# Patient Record
Sex: Male | Born: 1990 | Race: White | Hispanic: No | Marital: Single | Smoking: Never smoker
Health system: Southern US, Community
[De-identification: ages and names within clinical notes are randomized; demographics above are authoritative.]

---
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
## 2014-09-15 ENCOUNTER — Ambulatory Visit: Payer: Self-pay | Admitting: General Practice

## 2015-09-09 ENCOUNTER — Other Ambulatory Visit: Payer: Self-pay | Admitting: Emergency Medicine

## 2015-09-09 MED ORDER — PAROXETINE HCL 20 MG PO TABS: 20.0000 mg | ORAL_TABLET | Freq: Every day | ORAL | Status: AC

## 2015-09-09 NOTE — Telephone Encounter (Signed)
Med refill approved, pt will need to be seen in clinic after 6 month refill runs out

## 2015-09-09 NOTE — Telephone Encounter (Signed)
Received a faxed medication request from [facility].  Please advise.  Thank you. 

## 2015-09-14 ENCOUNTER — Ambulatory Visit: Payer: Self-pay | Admitting: Registered Nurse

## 2015-09-14 VITALS — BP 100/60 | HR 75 | Temp 98.3°F

## 2015-09-14 DIAGNOSIS — J0100 Acute maxillary sinusitis, unspecified: Secondary | ICD-10-CM

## 2015-09-14 DIAGNOSIS — H6593 Unspecified nonsuppurative otitis media, bilateral: Secondary | ICD-10-CM

## 2015-09-14 DIAGNOSIS — F329 Major depressive disorder, single episode, unspecified: Secondary | ICD-10-CM | POA: Insufficient documentation

## 2015-09-14 DIAGNOSIS — J029 Acute pharyngitis, unspecified: Secondary | ICD-10-CM

## 2015-09-14 DIAGNOSIS — J301 Allergic rhinitis due to pollen: Secondary | ICD-10-CM

## 2015-09-14 DIAGNOSIS — F419 Anxiety disorder, unspecified: Secondary | ICD-10-CM | POA: Insufficient documentation

## 2015-09-14 MED ORDER — AMOXICILLIN-POT CLAVULANATE 875-125 MG PO TABS: 1.0000 | ORAL_TABLET | Freq: Two times a day (BID) | ORAL | Status: AC

## 2015-09-14 MED ORDER — ACETAMINOPHEN 500 MG PO TABS: 1000.0000 mg | ORAL_TABLET | Freq: Four times a day (QID) | ORAL | Status: AC | PRN

## 2015-09-14 MED ORDER — PREDNISONE 20 MG PO TABS: 20.0000 mg | ORAL_TABLET | Freq: Every day | ORAL | Status: AC

## 2015-09-14 MED ORDER — SALINE SPRAY 0.65 % NA SOLN
2.0000 | NASAL | Status: AC | PRN
Start: 2015-09-14 — End: ?

## 2015-09-14 MED ORDER — LORATADINE 10 MG PO TABS: 10.0000 mg | ORAL_TABLET | Freq: Every day | ORAL | Status: DC

## 2015-09-15 NOTE — Progress Notes (Signed)
Subjective:    Patient ID: Jesse Johnston, male    DOB: 1990-09-27, 25 y.o.   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 The current episode started more than 1 year ago. The problem has been unchanged. Neither side of throat is experiencing more pain than the other. There has been no fever. The pain is at a severity of 5/10. The pain is moderate. Associated symptoms include congestion and headaches. Pertinent negatives include no abdominal pain, coughing, diarrhea, drooling, ear discharge, ear pain, hoarse voice, plugged ear sensation, neck pain, shortness of breath, stridor, swollen glands, trouble swallowing or vomiting. He has had exposure to strep. He has had no exposure to mono. He has tried cool liquids for the symptoms. The treatment provided no relief.      Review of Systems  Constitutional: Negative for fever, chills, diaphoresis, activity change, appetite change and fatigue.  HENT: Positive for congestion, sinus pressure and sore throat. Negative for dental problem, drooling, ear discharge, ear pain, facial swelling, hearing loss, hoarse voice, mouth sores, nosebleeds, postnasal drip, rhinorrhea, sneezing and trouble swallowing.   Eyes: Negative for photophobia, pain, discharge, redness, itching and visual disturbance.  Respiratory: Negative for cough, shortness of breath and stridor.   Gastrointestinal: Negative for vomiting, abdominal pain and diarrhea.  Endocrine: Negative for cold intolerance and heat intolerance.  Genitourinary: Negative for difficulty urinating.  Musculoskeletal: Negative for back pain, joint swelling, gait problem,  neck pain and neck stiffness.  Skin: Negative for rash.  Allergic/Immunologic: Negative for environmental allergies and food allergies.  Neurological: Positive for headaches. Negative for dizziness, tremors, seizures, syncope, facial asymmetry, speech difficulty, weakness, light-headedness and numbness.  Hematological: Negative for adenopathy. Does not bruise/bleed easily.  Psychiatric/Behavioral: Negative for sleep disturbance.       Objective:   Physical Exam  Constitutional: He is oriented to person, place, and time. Vital signs are normal. He appears well-developed and well-nourished. He is active and cooperative.  Non-toxic appearance. He does not have a sickly appearance. He appears ill. No distress.  HENT:  Head: Normocephalic and atraumatic.  Right Ear: Hearing, external ear and ear canal normal. A middle ear effusion is present.  Left Ear: Hearing, external ear and ear canal normal. A middle ear effusion is present.  Nose: Mucosal edema and rhinorrhea present. No nose lacerations, sinus tenderness, nasal deformity, septal deviation or nasal septal hematoma. No epistaxis.  No foreign bodies. Right sinus exhibits maxillary sinus tenderness. Right sinus exhibits no frontal sinus tenderness. Left sinus exhibits maxillary sinus tenderness. Left sinus exhibits no frontal sinus tenderness.  Mouth/Throat: Uvula is midline and mucous membranes are normal. Mucous membranes are not pale, not dry and not cyanotic. He does not have dentures. No oral lesions. No trismus in the jaw. Normal dentition. No dental abscesses, uvula swelling, lacerations or dental caries. Posterior oropharyngeal edema and posterior oropharyngeal erythema present. No oropharyngeal exudate or tonsillar abscesses.  Cobblestoning posterior pharynx; cryptic tonsils enlarged 3+ symmetrical bilaterally edema/erythema; bilateral nasal turbinates with edema, erythema yellow discharge  Eyes: Conjunctivae, EOM and lids are normal.  Pupils are equal, round, and reactive to light. Right eye exhibits no chemosis, no discharge, no exudate and  no hordeolum. No foreign body present in the right eye. Left eye exhibits no chemosis, no discharge, no exudate and no hordeolum. No foreign body present in the left eye. Right conjunctiva is not injected. Right conjunctiva has no hemorrhage. Left conjunctiva is not injected. Left conjunctiva has no hemorrhage. No scleral icterus. Right eye exhibits normal extraocular motion and no nystagmus. Left eye exhibits normal extraocular motion and no nystagmus. Right pupil is round and reactive. Left pupil is round and reactive. Pupils are equal.  Neck: Trachea normal and normal range of motion. Neck supple. No tracheal tenderness, no spinous process tenderness and no muscular tenderness present. No rigidity. No tracheal deviation, no edema, no erythema and normal range of motion present. No thyroid mass and no thyromegaly present.  Cardiovascular: Normal rate, regular rhythm, S1 normal, S2 normal, normal heart sounds and intact distal pulses.  PMI is not displaced.  Exam reveals no gallop and no friction rub.   No murmur heard. Pulmonary/Chest: Effort normal and breath sounds normal. No stridor. No respiratory distress. He has no decreased breath sounds. He has no wheezes. He has no rhonchi. He has no rales.  Abdominal: Soft. He exhibits no distension.  Musculoskeletal: Normal range of motion. He exhibits no edema or tenderness.  Lymphadenopathy:       Head (right side): No submental, no submandibular, no tonsillar, no preauricular, no posterior auricular and no occipital adenopathy present.       Head (left side): No submental, no submandibular, no tonsillar, no preauricular, no posterior auricular and no occipital adenopathy present.    He has no cervical adenopathy.       Right cervical: No superficial cervical, no deep cervical and no posterior cervical adenopathy present.      Left cervical: No  superficial cervical, no deep cervical and no posterior cervical adenopathy present.  Neurological: He is alert and oriented to person, place, and time. He displays no atrophy and no tremor. No cranial nerve deficit or sensory deficit. He exhibits normal muscle tone. He displays no seizure activity. Coordination and gait normal. GCS eye subscore is 4. GCS verbal subscore is 5. GCS motor subscore is 6.  Skin: Skin is warm, dry and intact. No abrasion, no bruising, no burn, no ecchymosis, no laceration, no lesion, no petechiae and no rash noted. He is not diaphoretic. No cyanosis or erythema. No pallor. Nails show no clubbing.  Psychiatric: He has a normal mood and affect. His speech is normal and behavior is normal. Judgment and thought content normal. Cognition and memory are normal.  Nursing note and vitals reviewed.         Assessment & Plan:  A-maxillary sinusitis acute, pharyngitis acute, bilateral otitis media effusion P-Supportive treatment.   No evidence of invasive bacterial infection, non toxic and well hydrated.  This is most likely self limiting viral infection.  I do not see where any further testing or imaging is necessary at this time.   I will suggest supportive care, rest, good hygiene and encourage the patient to take adequate fluids.  The patient is to return to clinic or EMERGENCY ROOM if symptoms worsen or change significantly e.g. ear pain, fever, purulent discharge from ears or bleeding.  Patient verbalized agreement and understanding of treatment plan.    Usually no specific medical treatment is needed if a virus is causing the sore throat.  The throat most often gets better on its own within 5 to 7 days.  Antibiotic medicine does not cure viral pharyngitis.  Restart claritin  po daily.  Avoid motrin use with paxil.  May use tylenol  po QID. For acute pharyngitis caused by bacteria, your healthcare provider will prescribe an antibiotic.  Marland Kitchen Do not smoke.  Marland Kitchen Avoid  secondhand smoke and other air pollutants.  . Use a cool mist humidifier to add moisture to the air.  . Get plenty of rest.  . You may want to rest your throat by talking less and eating a diet that is mostly liquid or soft for a day or two.   Marland Kitchen Nonprescription throat lozenges and mouthwashes should help relieve the soreness.   . Gargling with warm saltwater and drinking warm liquids may help.  (You can make a saltwater solution by adding 1/4 teaspoon of salt to 8 ounces, or 240 mL, of warm water.)  . A nonprescription pain reliever such as acetaminophen may ease general aches and pains.   FOLLOW UP with clinic provider if no improvements in the next 7-10 days.  Patient verbalized understanding of instructions and agreed with plan of care. P2:  Hand washing and diet.  Restart flonase 1 spray each nostril BID, saline 2 sprays each nostril q2h prn congestion.  If no improvement with 48 hours of saline and flonase use start augmentin  po BID x 10 days. If no improvement with augmentin and flonase start prednisone  po daily in 48 hours x 5 days.  Rx given.  No evidence of systemic bacterial infection, non toxic and well hydrated.  I do not see where any further testing or imaging is necessary at this time.   I will suggest supportive care, rest, good hygiene and encourage the patient to take adequate fluids.  The patient is to return to clinic or EMERGENCY ROOM if symptoms worsen or change significantly.  Exitcare handout on sinusitis given to patient.  Patient verbalized agreement and understanding of treatment plan and had no further questions at this time.   P2:  Hand washing and cover cough

## 2016-07-23 IMAGING — US US SCROTUM W/ DOPPLER COMPLETE
1 series · 13 of 25 positions shown · non-contrast
Comparison: None.

CLINICAL DATA: Left testicular pain for 2 days

EXAM:
SCROTAL ULTRASOUND
DOPPLER ULTRASOUND OF THE TESTICLES
TECHNIQUE: Complete ultrasound examination of the testicles, epididymis, and
other scrotal structures was performed. Color and spectral Doppler
ultrasound were also utilized to evaluate blood flow to the
testicles.

[Series 1: us scrotum w/ doppler complete · 0.08mm/px · 13 of 77 slices shown]
[im 1/77]
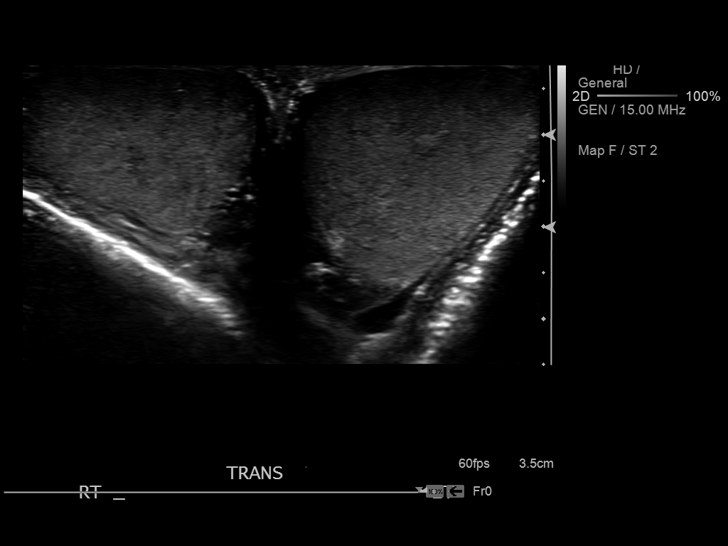
[im 7/77]
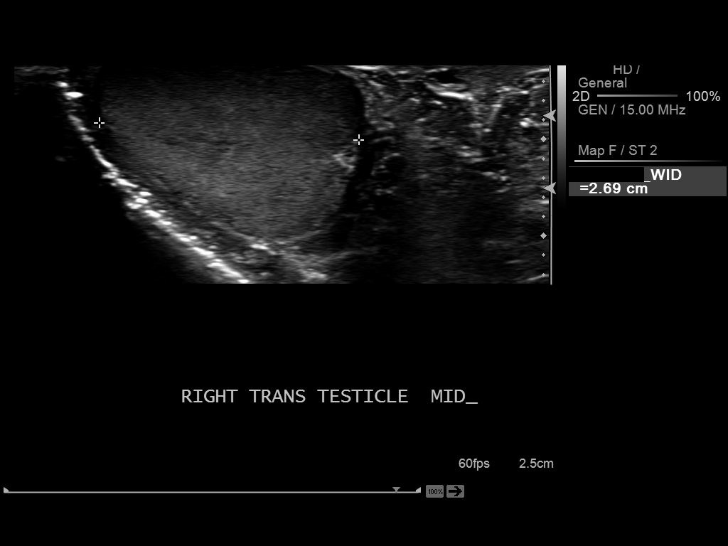
[im 13/77]
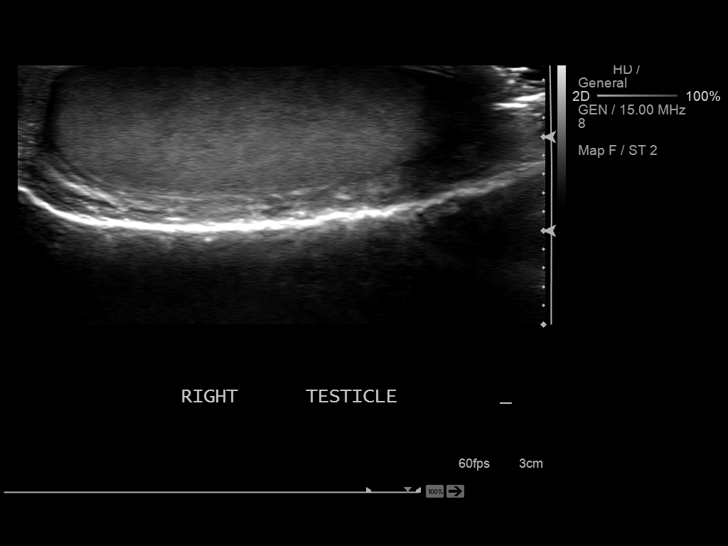
[im 20/77]
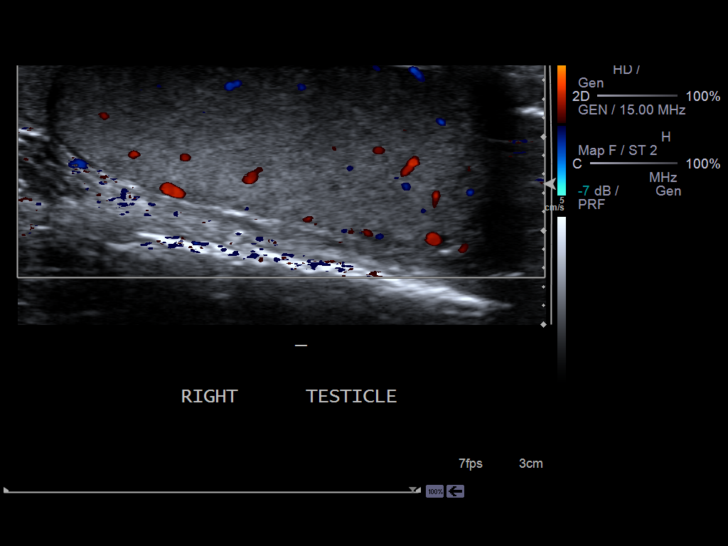
[im 26/77]
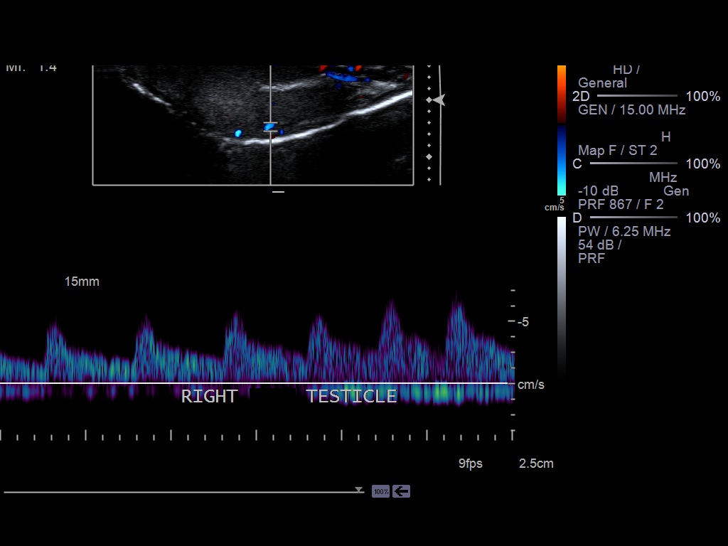
[im 32/77]
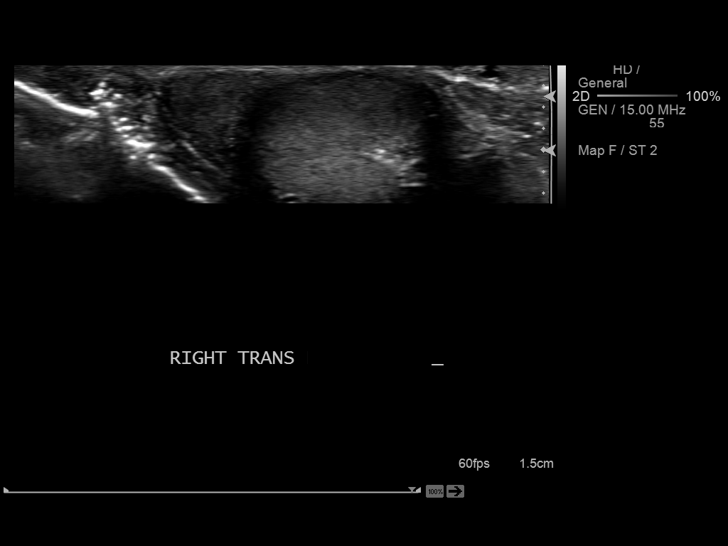
[im 39/77]
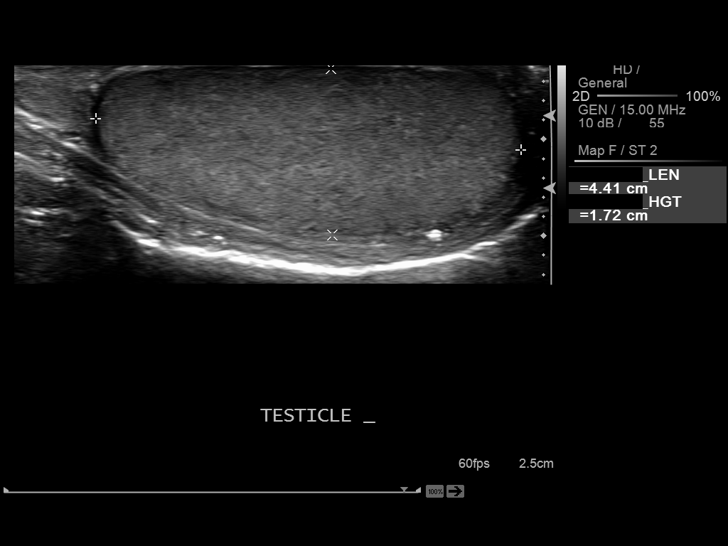
[im 45/77]
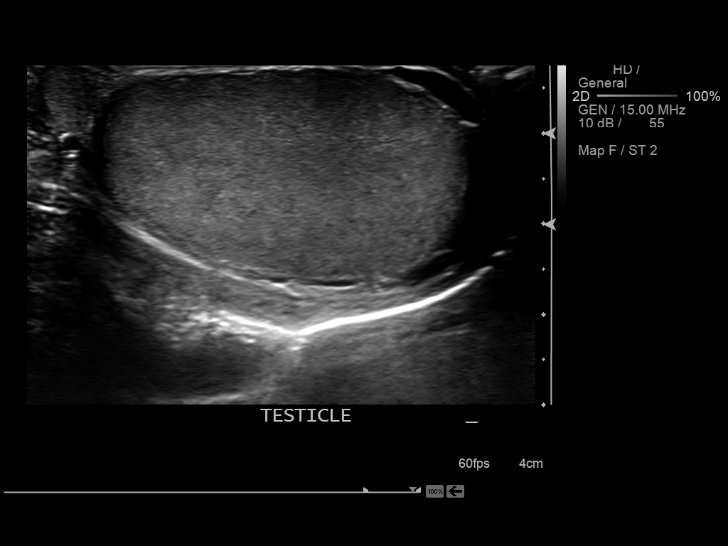
[im 51/77]
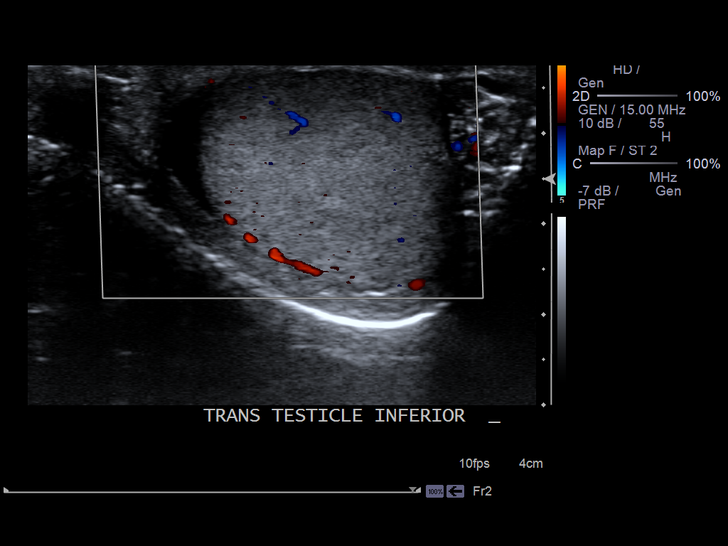
[im 58/77]
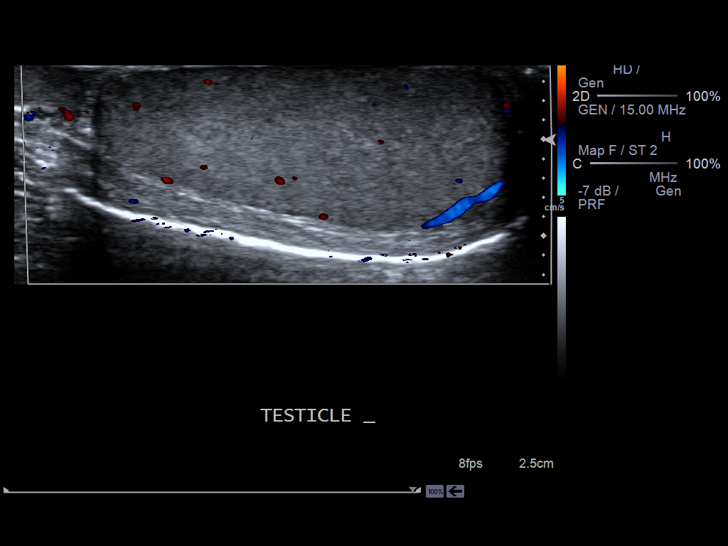
[im 64/77]
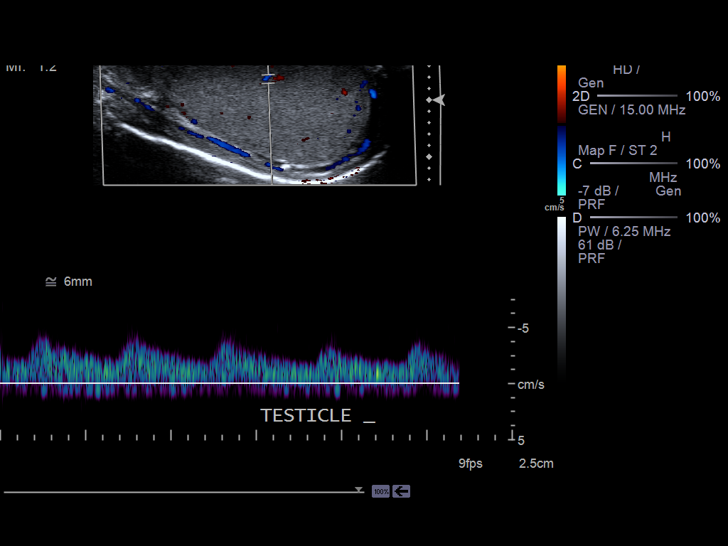
[im 70/77]
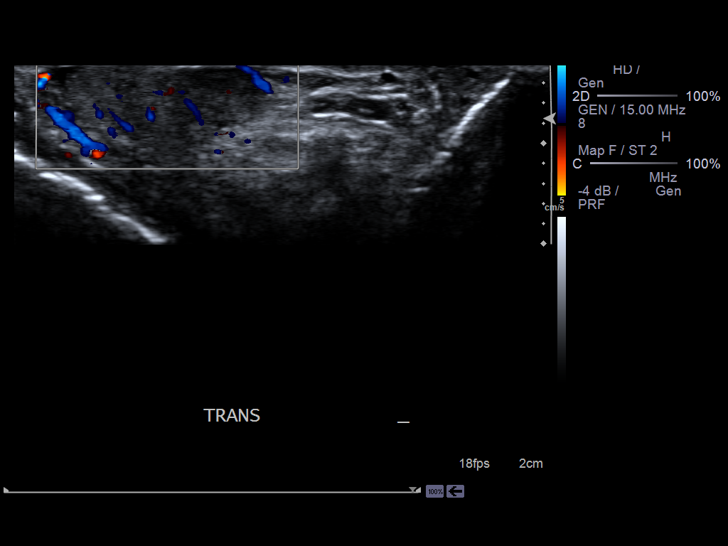
[im 77/77]
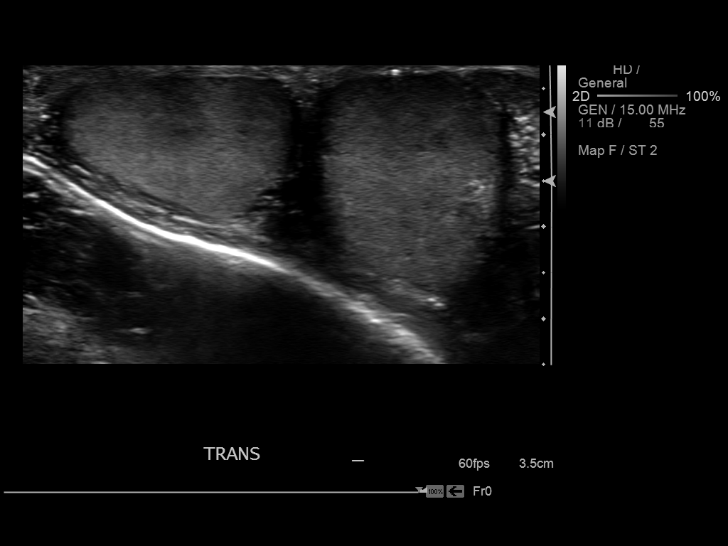

[13 of 25 positions shown; findings below may reference images not displayed]

FINDINGS: Right testicle

Measurements: The right testicle is normal in size measuring 4.6 x
1.9 x 2.7 cm.. No intratesticular abnormality is seen. Blood flow is
demonstrated to the right testicle with arterial and venous
waveforms.

Left testicle

Measurements: The left testicle is normal in size measuring 4.4 x
1.7 x 2.7 cm.. No intratesticular abnormality is seen. Blood flow is
demonstrated to the left testicle with arterial and venous
waveforms.

Right epididymis:  The right epididymis is unremarkable.

Left epididymis:  The left epididymis is unremarkable.

Hydrocele:  A small amount of fluid is noted on the left.

Varicocele:  No hydrocele is seen.

Pulsed Doppler interrogation of both testes demonstrates normal low
resistance arterial and venous waveforms bilaterally.
IMPRESSION: 1. No intratesticular abnormality is seen. Blood flow is
demonstrated to both testicles.
2. There is a small amount of fluid on the left.

## 2017-08-20 ENCOUNTER — Encounter: Payer: Self-pay | Admitting: Family Medicine

## 2017-08-20 ENCOUNTER — Ambulatory Visit: Payer: Self-pay | Admitting: Family Medicine

## 2017-08-20 VITALS — BP 120/82 | HR 82 | Temp 98.1°F

## 2017-08-20 DIAGNOSIS — R300 Dysuria: Secondary | ICD-10-CM

## 2017-08-20 DIAGNOSIS — Z711 Person with feared health complaint in whom no diagnosis is made: Secondary | ICD-10-CM

## 2017-08-20 DIAGNOSIS — J029 Acute pharyngitis, unspecified: Secondary | ICD-10-CM | POA: Insufficient documentation

## 2017-08-20 LAB — POCT URINALYSIS DIPSTICK
Blood, UA: NEGATIVE
Glucose, UA: NEGATIVE
KETONES UA: NEGATIVE
Leukocytes, UA: NEGATIVE
NITRITE UA: NEGATIVE
Spec Grav, UA: >=1.03 — AB (ref 1.010–1.025)
Urobilinogen, UA: 1 E.U./dL
pH, UA: 5.5 (ref 5.0–8.0)

## 2017-08-20 LAB — POCT INFLUENZA A/B
Influenza A, POC: NEGATIVE
Influenza B, POC: NEGATIVE

## 2017-08-20 NOTE — Patient Instructions (Signed)
Drink plenty of fluids to keep yourself well flushed out  If the urinary pain is not improving over the next couple of days I will probably go ahead and start you on an antibiotic anyhow.  I am here Tuesday and Wednesday so let me know if you are not improving.  Get rechecked as needed

## 2017-08-20 NOTE — Progress Notes (Signed)
Patient ID: Jesse Johnston, male    DOB: 02/18/1991  Age: 27 y.o. MRN: [id number]  Chief Complaint  Patient presents with  . Groin Pain    x3 days    Subjective:   Patient is here for dysuria for the last 3 days.  It is a little bit better today.  He has not had any pus or blood from his urine.  He has had an STD in the remote past, I believe chlamydia.  However not had intercourse for 2 or 3 years.  He does masturbate.  He denies any trauma.  No fever or chills.  No history of herpes.  Current allergies, medications, problem list, past/family and social histories reviewed.  Objective:  BP 120/82   Pulse 82   Temp 98.1 F (36.7 C) (Oral)   SpO2 97%   No acute distress.  Normal male external genitalia.  Urethra does not appear inflamed.  No lesions on the penis shaft.  He has a little cyst at the right groin area that he says was a little boil.  He has bad cystic acne elsewhere on his skin.  Urinalysis does not have any blood or WBCs on dip  Assessment & Plan:   Assessment: 1. Dysuria   2. Concern about STD in male without diagnosis   3. Sore throat       Plan: See instructions.  I did decide to work him up for STDs even though it has been a while.  Certainly at least herpes can cause symptoms this far out.  Reassurance.  Call back if not improving and I will call in some doxycycline form.  By error another patient's strep test and sore throat diagnosis got injured, and we are unable to remove it from the computer. Orders Placed This Encounter  Procedures  . GC/Chlamydia Probe Amp    Order Specific Question:   Source    Answer:   dirty urine  . HSV(herpes simplex vrs) 1+2 ab-IgG  . RPR  . HIV antibody  . POCT Influenza A/B (POC66)    No orders of the defined types were placed in this encounter.        Patient Instructions  Drink plenty of fluids to keep yourself well flushed out  If the urinary pain is not improving over the next couple of days I  will probably go ahead and start you on an antibiotic anyhow.  I am here Tuesday and Wednesday so let me know if you are not improving.  Get rechecked as needed    No Follow-up on file.   Cam Dauphin, MD 08/20/2017

## 2017-08-21 LAB — RPR: RPR Ser Ql: NONREACTIVE

## 2017-08-21 LAB — HIV ANTIBODY (ROUTINE TESTING W REFLEX): HIV Screen 4th Generation wRfx: NONREACTIVE

## 2017-08-21 LAB — HSV(HERPES SIMPLEX VRS) I + II AB-IGG
HSV 1 Glycoprotein G Ab, IgG: <0.91 index (ref 0.00–0.90)
HSV 2 IgG, Type Spec: <0.91 index (ref 0.00–0.90)

## 2017-08-22 ENCOUNTER — Ambulatory Visit: Payer: Self-pay | Admitting: Family Medicine

## 2017-08-22 ENCOUNTER — Encounter: Payer: Self-pay | Admitting: Family Medicine

## 2017-08-22 VITALS — BP 118/72 | HR 91 | Temp 98.2°F

## 2017-08-22 DIAGNOSIS — L739 Follicular disorder, unspecified: Secondary | ICD-10-CM

## 2017-08-22 DIAGNOSIS — B356 Tinea cruris: Secondary | ICD-10-CM

## 2017-08-22 LAB — GC/CHLAMYDIA PROBE AMP
CHLAMYDIA, DNA PROBE: NEGATIVE
NEISSERIA GONORRHOEAE BY PCR: NEGATIVE

## 2017-08-22 MED ORDER — TERBINAFINE HCL 1 % EX CREA: 1.0000 "application " | TOPICAL_CREAM | Freq: Two times a day (BID) | CUTANEOUS | 0 refills | Status: AC

## 2017-08-22 MED ORDER — DOXYCYCLINE HYCLATE 100 MG PO TABS: 100.0000 mg | ORAL_TABLET | Freq: Two times a day (BID) | ORAL | 0 refills | Status: AC

## 2017-08-22 NOTE — Progress Notes (Signed)
Patient ID: Leafy RoDaniel Michael Fatzinger, male    DOB: 03/26/1991  Age: 27 y.o. MRN: [id number]  Chief Complaint  Patient presents with  . Abscess    Subjective:   The dysuria he had a few days ago has resolved.  He did showed me a little cystic area to the right of the pain is at the top of the scrotum the other day which did not look very remarkable.    Current allergies, medications, problem list, past/family and social histories reviewed.  Objective:  BP 118/72 (BP Location: Right Arm, Patient Position: Sitting)   Pulse 91   Temp 98.2 F (36.8 C) (Tympanic)   SpO2 96%   Unfortunately I am now seeing that he does have some rash in the crease of the groin lateral to that and down between his legs.  This area is red and inflamed and a little tender.  It is well demarcated.  It appears to be a typical tinea cruris.  He does still have that little acneiform cyst in the hair at the top of the scrotum.  Assessment & Plan:   Assessment: 1. Tinea cruris   2. Folliculitis       Plan: He has tinea cruris but he also has the cystic acne.  I think I will treat him for both.  No orders of the defined types were placed in this encounter.   No orders of the defined types were placed in this encounter.        Patient Instructions  You do have the tinea cruris (jock itch) which is a fungal infection of the groin.  You can use some terbinafine cream twice daily on that.  If it does not seem to clear it up there is oral medication that can also be tried, but usually the cream works fine.  Take doxycycline 100 mg twice daily for 10 days for the little cysts you have in the skin on the scrotum.  The graft be careful about prolonged sun exposure while on this medicine since you can send it is here.  Wash well at least once daily and dry the skin well between your legs.  Return as needed   Jock Itch Jock itch (tinea cruris) is a fungal infection of the skin in the groin area. It is  sometimes called ringworm, even though it is not caused by worms. It is caused by a fungus, which is a type of germ that thrives in dark, damp places. Jock itch causes a rash and itching in the groin and upper thigh area. It usually goes away in 2-3 weeks with treatment. What are the causes? The fungus that causes jock itch may be spread by:  Touching a fungus infection elsewhere on your body-such as athlete's foot-and then touching your groin area.  Sharing towels or clothing with an infected person.  What increases the risk? Jock itch is most common in men and adolescent boys. This condition is more likely to develop from:  Being in hot, humid climates.  Wearing tight-fitting clothing or wet bathing suits for long periods of time.  Participating in sports.  Being overweight.  Having diabetes.  What are the signs or symptoms? Symptoms of jock itch may include:  A red, pink, or brown rash in the groin area. The rash may spread to the thighs, anus, and buttocks.  Dry and scaly skin on or around the rash.  Itchiness.  How is this diagnosed? Most often, a health care provider can make the  diagnosis by looking at your rash. Sometimes, a scraping of the infected skin will be taken. This sample may be tested by looking at it under a microscope or by trying to grow the fungus from the sample (culture). How is this treated? Treatment for this condition may include:  Antifungal medicine to kill the fungus. This may be in various forms: ? Skin cream or ointment. ? Medicine taken by mouth.  Skin cream or ointment to reduce the itching.  Compresses or medicated powders to dry the infected skin.  Follow these instructions at home:  Take medicines only as directed by your health care provider. Apply skin creams or ointments exactly as directed.  Wear loose-fitting clothing. ? Men should wear cotton boxer shorts. ? Women should wear cotton underwear.  Change your underwear every  day to keep your groin dry.  Avoid hot baths.  Dry your groin area well after bathing. ? Use a separate towel to dry your groin area. This will help to prevent a spreading of the infection to other areas of your body.  Do not scratch the affected area.  Do not share towels with other people. Contact a health care provider if:  Your rash does not improve or it gets worse after 2 weeks of treatment.  Your rash is spreading.  Your rash returns after treatment is finished.  You have a fever.  You have redness, swelling, or pain in the area around your rash.  You have fluid, blood, or pus coming from your rash.  Your have your rash for more than 4 weeks. This information is not intended to replace advice given to you by your health care provider. Make sure you discuss any questions you have with your health care provider. Document Released: 06/23/2002 Document Revised: 12/09/2015 Document Reviewed: 04/14/2014 Elsevier Interactive Patient Education  [employer].  Return as needed    No Follow-up on file.   HOPPER,DAVID, MD 08/22/2017

## 2017-08-22 NOTE — Progress Notes (Signed)
Pt reports "rash" to R groin that he noticed yesterday. Is TTP and "hot". Denies any drainage. Reports boil in same location one month ago that he drained himself and "thought he got all of it out."

## 2017-08-22 NOTE — Patient Instructions (Addendum)
You do have the tinea cruris (jock itch) which is a fungal infection of the groin.  You can use some terbinafine cream twice daily on that.  If it does not seem to clear it up there is oral medication that can also be tried, but usually the cream works fine.  Take doxycycline 100 mg twice daily for 10 days for the little cysts you have in the skin on the scrotum.  The graft be careful about prolonged sun exposure while on this medicine since you can send it is here.  Wash well at least once daily and dry the skin well between your legs.  Return as needed   Jock Itch Jock itch (tinea cruris) is a fungal infection of the skin in the groin area. It is sometimes called ringworm, even though it is not caused by worms. It is caused by a fungus, which is a type of germ that thrives in dark, damp places. Jock itch causes a rash and itching in the groin and upper thigh area. It usually goes away in 2-3 weeks with treatment. What are the causes? The fungus that causes jock itch may be spread by:  Touching a fungus infection elsewhere on your body-such as athlete's foot-and then touching your groin area.  Sharing towels or clothing with an infected person.  What increases the risk? Jock itch is most common in men and adolescent boys. This condition is more likely to develop from:  Being in hot, humid climates.  Wearing tight-fitting clothing or wet bathing suits for long periods of time.  Participating in sports.  Being overweight.  Having diabetes.  What are the signs or symptoms? Symptoms of jock itch may include:  A red, pink, or brown rash in the groin area. The rash may spread to the thighs, anus, and buttocks.  Dry and scaly skin on or around the rash.  Itchiness.  How is this diagnosed? Most often, a health care provider can make the diagnosis by looking at your rash. Sometimes, a scraping of the infected skin will be taken. This sample may be tested by looking at it under a  microscope or by trying to grow the fungus from the sample (culture). How is this treated? Treatment for this condition may include:  Antifungal medicine to kill the fungus. This may be in various forms: ? Skin cream or ointment. ? Medicine taken by mouth.  Skin cream or ointment to reduce the itching.  Compresses or medicated powders to dry the infected skin.  Follow these instructions at home:  Take medicines only as directed by your health care provider. Apply skin creams or ointments exactly as directed.  Wear loose-fitting clothing. ? Men should wear cotton boxer shorts. ? Women should wear cotton underwear.  Change your underwear every day to keep your groin dry.  Avoid hot baths.  Dry your groin area well after bathing. ? Use a separate towel to dry your groin area. This will help to prevent a spreading of the infection to other areas of your body.  Do not scratch the affected area.  Do not share towels with other people. Contact a health care provider if:  Your rash does not improve or it gets worse after 2 weeks of treatment.  Your rash is spreading.  Your rash returns after treatment is finished.  You have a fever.  You have redness, swelling, or pain in the area around your rash.  You have fluid, blood, or pus coming from your rash.  Your have your rash for more than 4 weeks. This information is not intended to replace advice given to you by your health care provider. Make sure you discuss any questions you have with your health care provider. Document Released: 06/23/2002 Document Revised: 12/09/2015 Document Reviewed: 04/14/2014 Elsevier Interactive Patient Education  [employer]2018 [employer]  Return as needed

## 2019-12-07 ENCOUNTER — Other Ambulatory Visit: Payer: Self-pay

## 2019-12-07 ENCOUNTER — Emergency Department
Admission: EM | Admit: 2019-12-07 | Discharge: 2019-12-07 | Disposition: A | Discharge status: Home / Self Care | Payer: BLUE CROSS/BLUE SHIELD | Attending: Emergency Medicine | Admitting: Emergency Medicine

## 2019-12-07 ENCOUNTER — Emergency Department: Payer: BLUE CROSS/BLUE SHIELD

## 2019-12-07 DIAGNOSIS — R1031 Right lower quadrant pain: Secondary | ICD-10-CM | POA: Diagnosis present

## 2019-12-07 DIAGNOSIS — N433 Hydrocele, unspecified: Secondary | ICD-10-CM | POA: Diagnosis not present

## 2019-12-07 DIAGNOSIS — N50819 Testicular pain, unspecified: Secondary | ICD-10-CM

## 2019-12-07 MED ORDER — KETOROLAC TROMETHAMINE 30 MG/ML IJ SOLN
10.0000 mg | Freq: Once | INTRAMUSCULAR | Status: DC
  Filled 2019-12-07: qty 1

## 2019-12-07 MED ORDER — SODIUM CHLORIDE 0.9 % IV BOLUS: 1000.0000 mL | Freq: Once | INTRAVENOUS | Status: DC

## 2019-12-07 NOTE — ED Provider Notes (Signed)
[facility] Emergency Department Provider Note   ____________________________________________   First MD Initiated Contact with Patient 12/07/19 2310     (approximate)  I have reviewed the triage vital signs and the nursing notes.   HISTORY  Chief Complaint Right groin pain   HPI Jesse Johnston is a 29 y.o. male who presents to the ED from home with a chief complaint of right groin pain.  Patient reports pain after waking up from nap around 2 PM.  Denies trauma/injury.  Reports dull pain radiating down his right leg.  Symptoms not associated with fever, abdominal pain, nausea, vomiting, dysuria or urethral discharge.  Has not had symptoms previously.      Past medical history Left hydrocele   Patient Active Problem List   Diagnosis Date Noted  . Sore throat 08/20/2017  . Anxiety and depression 09/14/2015    History reviewed. No pertinent surgical history.  Prior to Admission medications   Medication Sig Start Date End Date Taking? Authorizing Provider  acetaminophen (TYLENOL) 500 MG tablet Take 2 tablets (1,000 mg total) by mouth every 6 (six) hours as needed for mild pain, moderate pain, fever or headache. 09/14/15   Betancourt, Aura Fey, NP  doxycycline (VIBRA-TABS) 100 MG tablet Take 1 tablet (100 mg total) by mouth 2 (two) times daily. 08/22/17   Posey Boyer, MD  PARoxetine (PAXIL) 20 MG tablet Take 1 tablet (20 mg total) by mouth daily. 09/09/15   Caryn Section Linden Dolin, PA-C  sodium chloride (OCEAN) 0.65 % SOLN nasal spray Place 2 sprays into both nostrils every 2 (two) hours as needed for congestion. 09/14/15   Betancourt, Aura Fey, NP  terbinafine (LAMISIL) 1 % cream Apply 1 application topically 2 (two) times daily. 08/22/17   Posey Boyer, MD    Allergies Patient has no known allergies.  History reviewed. No pertinent family history.  Social History Social History   Tobacco Use  . Smoking status: Never Smoker  . Smokeless  tobacco: Never Used  Substance Use Topics  . Alcohol use: Never  . Drug use: Never    Review of Systems  Constitutional: No fever/chills Eyes: No visual changes. ENT: No sore throat. Cardiovascular: Denies chest pain. Respiratory: Denies shortness of breath. Gastrointestinal: No abdominal pain.  No nausea, no vomiting.  No diarrhea.  No constipation. Genitourinary: Positive for right groin pain.  Negative for dysuria. Musculoskeletal: Negative for back pain. Skin: Negative for rash. Neurological: Negative for headaches, focal weakness or numbness.   ____________________________________________   PHYSICAL EXAM:  VITAL SIGNS: ED Triage Vitals  Enc Vitals Group     BP 12/07/19 1741 (!) 152/82     Pulse Rate 12/07/19 1741 (!) 104     Resp 12/07/19 1741 20     Temp 12/07/19 1741 98.4 F (36.9 C)     Temp Source 12/07/19 1741 Oral     SpO2 12/07/19 1741 98 %     Weight 12/07/19 1742 190 lb (86.2 kg)     Height 12/07/19 1742 5\' 9"  (1.753 m)     Head Circumference --      Peak Flow --      Pain Score 12/07/19 1742 6     Pain Loc --      Pain Edu? --      Excl. in [place]? --     Constitutional: Alert and oriented. Well appearing and in no acute distress. Eyes: Conjunctivae are normal. PERRL. EOMI. Head: Atraumatic. Nose: No congestion/rhinnorhea. Mouth/Throat:  Mucous membranes are moist.  Oropharynx non-erythematous. Neck: No stridor.   Cardiovascular: Normal rate, regular rhythm. Grossly normal heart sounds.  Good peripheral circulation. Respiratory: Normal respiratory effort.  No retractions. Lungs CTAB. Gastrointestinal: Soft and nontender. No distention. No abdominal bruits. No CVA tenderness. Genitourinary: Circumcised male.  No urethral discharge.  No swelling to either testicle.  Right testicle mildly tender to palpation.  No palpable masses to suggest hernia.  Strong bilateral cremasteric reflexes. Musculoskeletal: No lower extremity tenderness nor edema.  No joint  effusions. Neurologic:  Normal speech and language. No gross focal neurologic deficits are appreciated. No gait instability. Skin:  Skin is warm, dry and intact. No rash noted. Psychiatric: Mood and affect are normal. Speech and behavior are normal.  ____________________________________________   LABS (all labs ordered are listed, but only abnormal results are displayed)  Labs Reviewed  CBC WITH DIFFERENTIAL/PLATELET  BASIC METABOLIC PANEL  URINALYSIS, COMPLETE (UACMP) WITH MICROSCOPIC   ____________________________________________  EKG  None ____________________________________________  RADIOLOGY  ED MD interpretation: Ultrasound demonstrates trace left hydrocele  Official radiology report(s): US SCROTUM W/DOPPLER  Result Date: 12/07/2019 CLINICAL DATA:  Sudden onset right testicular pain EXAM: SCROTAL ULTRASOUND DOPPLER ULTRASOUND OF THE TESTICLES TECHNIQUE: Complete ultrasound examination of the testicles, epididymis, and other scrotal structures was performed. Color and spectral Doppler ultrasound were also utilized to evaluate blood flow to the testicles. COMPARISON:  09/15/2014 FINDINGS: Right testicle Measurements: 3.3 x 2.3 by 2.8 cm. No mass or microlithiasis visualized. Left testicle Measurements: 3.5 x 2.0 by 2.7 cm. No mass or microlithiasis visualized. Right epididymis:  Normal in size and appearance. Left epididymis:  Normal in size and appearance. Hydrocele:  Trace left hydrocele. Varicocele:  None visualized. Pulsed Doppler interrogation of both testes demonstrates normal low resistance arterial and venous waveforms bilaterally. IMPRESSION: 1. Trace left hydrocele. Otherwise unremarkable testicular ultrasound. Electronically Signed   By: Sharlet Salina M.D.   On: 12/07/2019 19:45    ____________________________________________   PROCEDURES  Procedure(s) performed (including Critical  Care):  Procedures   ____________________________________________   INITIAL IMPRESSION / ASSESSMENT AND PLAN / ED COURSE  As part of my medical decision making, I reviewed the following data within the electronic MEDICAL RECORD NUMBER Nursing notes reviewed and incorporated, Labs reviewed, Old chart reviewed, Radiograph reviewed and Notes from prior ED visits     Leith Szafranski was evaluated in Emergency Department on 12/07/2019 for the symptoms described in the history of present illness. He was evaluated in the context of the global COVID-19 pandemic, which necessitated consideration that the patient might be at risk for infection with the SARS-CoV-2 virus that causes COVID-19. Institutional protocols and algorithms that pertain to the evaluation of patients at risk for COVID-19 are in a state of rapid change based on information released by regulatory bodies including the CDC and federal and state organizations. These policies and algorithms were followed during the patient's care in the ED.    29 year old male presenting with nontraumatic right groin pain. Differential diagnosis includes, but is not limited to, acute appendicitis, renal colic, testicular torsion, urinary tract infection/pyelonephritis, prostatitis,  epididymitis, diverticulitis, small bowel obstruction or ileus, colitis, abdominal aortic aneurysm, gastroenteritis, hernia, etc.  Ultrasound demonstrates trace left hydrocele.  No findings to explain right testicular/groin pain.  Will obtain basic lab work, UA, CT renal colic study.  Administer Toradol for pain.   Clinical Course as of Dec 07 2347  Sun Dec 07, 2019  2347 Patient has changed his mind and now declines  lab work, UA, CT and NSAID.  Will discharge home to follow-up with his PCP.  Strict return precautions given.  Patient verbalizes understanding and agrees with plan of care.   [JS]    Clinical Course User Index [JS] Irean Hong, MD      ____________________________________________   FINAL CLINICAL IMPRESSION(S) / ED DIAGNOSES  Final diagnoses:  Right groin pain  Hydrocele in adult     ED Discharge Orders    None       Note:  This document was prepared using Dragon voice recognition software and may include unintentional dictation errors.   Irean Hong, MD 12/08/19 [phone]

## 2019-12-07 NOTE — ED Triage Notes (Addendum)
Pt arrived to ed via pov, ambulatory to triage with c/o right sided  Dull testicle/groin pain, radiating down the rt leg. Pt reports pain started after waking from nap around 1350. Pt denies any redness/swelling to the area.

## 2019-12-07 NOTE — Discharge Instructions (Signed)
Return to the ER for worsening symptoms, persistent vomiting, difficulty breathing or other concerns. °

## 2019-12-07 NOTE — ED Notes (Signed)
This RN bedside, pt refusing medications and CT scan.  Dr Dolores Frame aware. Pt to be DC'd.

## 2019-12-08 ENCOUNTER — Ambulatory Visit: Admission: RE | Admit: 2019-12-08 | Payer: BLUE CROSS/BLUE SHIELD | Source: Ambulatory Visit

## 2019-12-08 NOTE — ED Notes (Signed)
Pt refused all labs and scans including Toradol. MD Dolores Frame aware and pt will be DC.

## 2021-10-14 IMAGING — US US SCROTUM W/ DOPPLER COMPLETE
1 series · 14 of 25 positions shown · non-contrast
Comparison: 09/15/2014

CLINICAL DATA: Sudden onset right testicular pain

EXAM:
SCROTAL ULTRASOUND
DOPPLER ULTRASOUND OF THE TESTICLES
TECHNIQUE: Complete ultrasound examination of the testicles, epididymis, and
other scrotal structures was performed. Color and spectral Doppler
ultrasound were also utilized to evaluate blood flow to the
testicles.

[Series 1: us scrotum w/doppler · 14 of 76 slices shown]
[im 1/76]
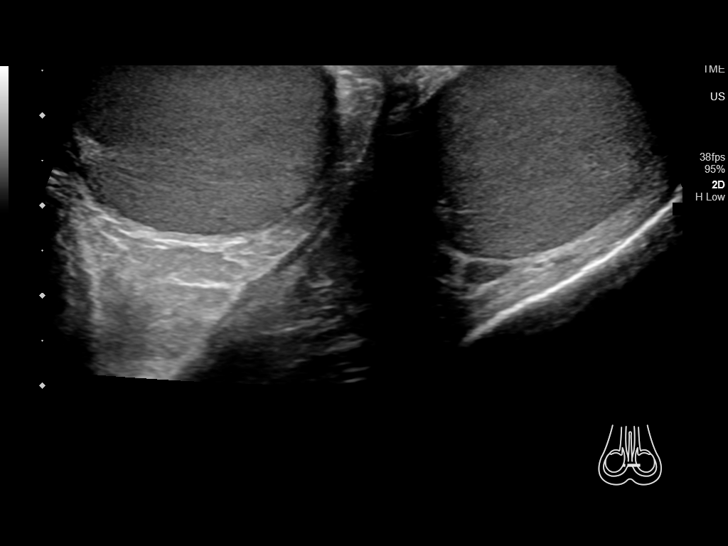
[im 7/76]
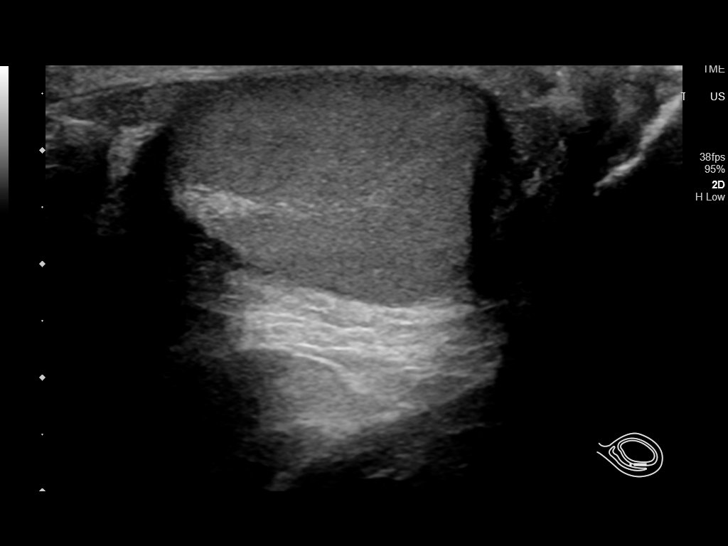
[im 13/76]
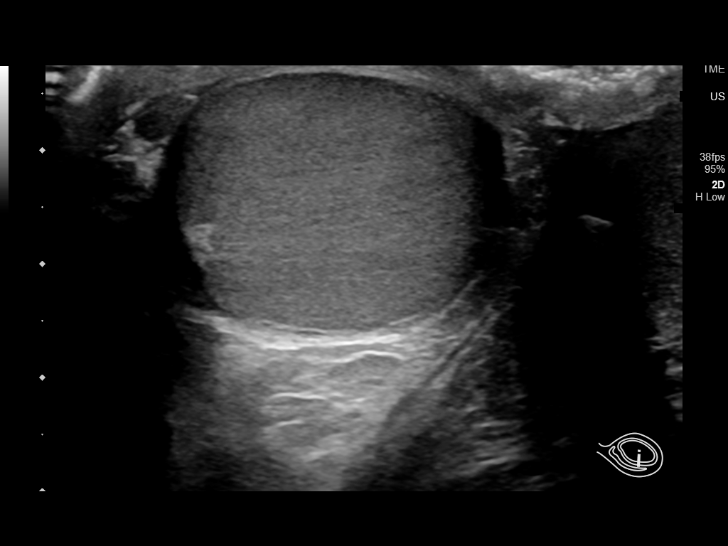
[im 19/76]
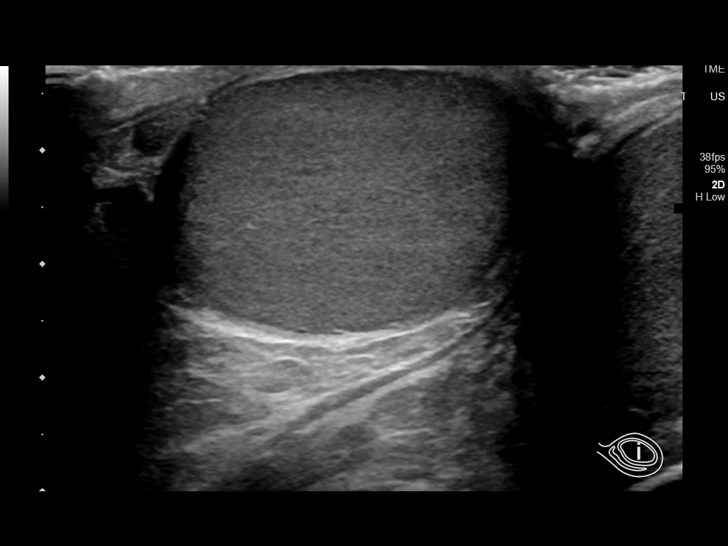
[im 26/76]
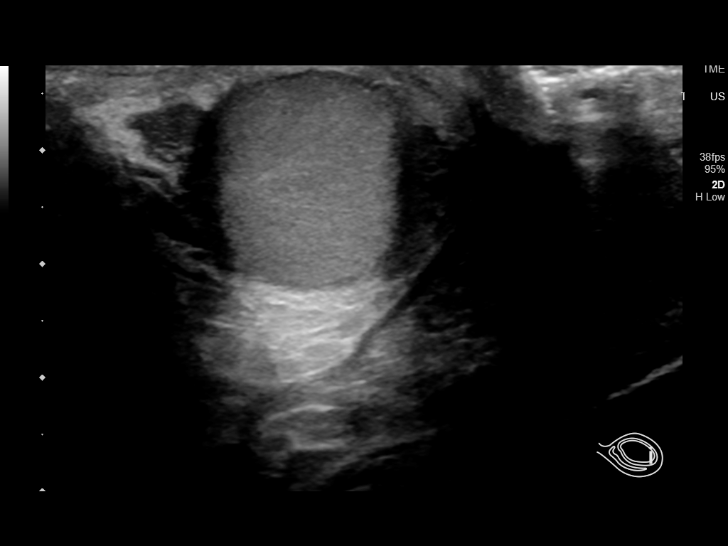
[im 29/76]
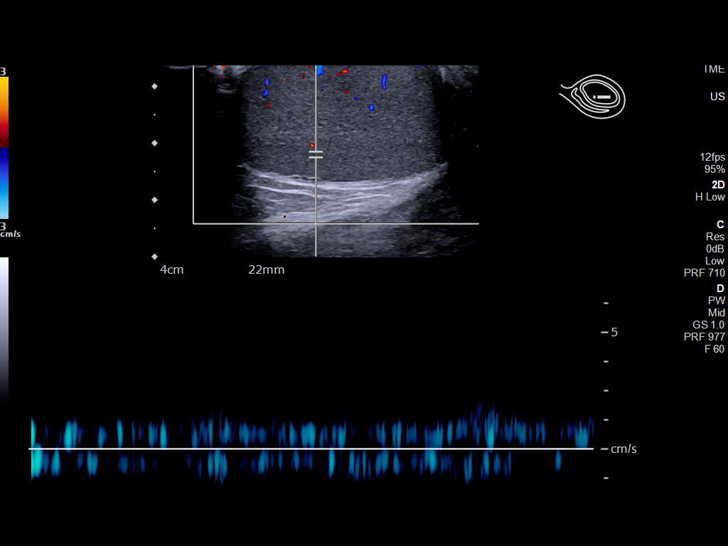
[im 35/76]
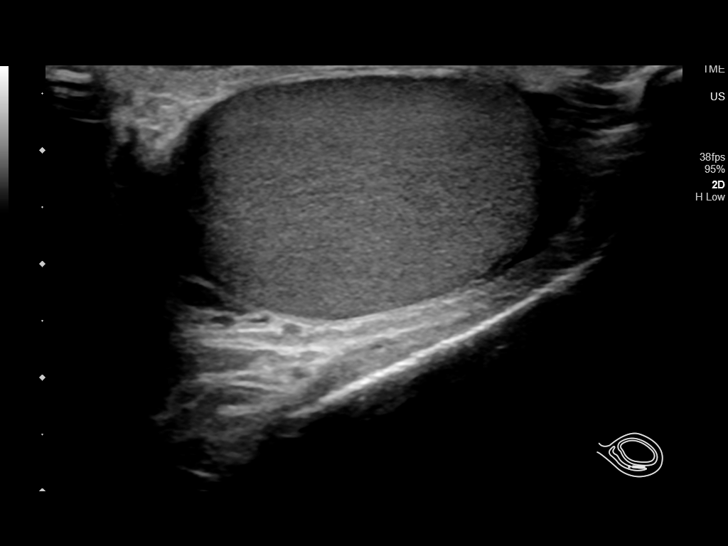
[im 41/76]
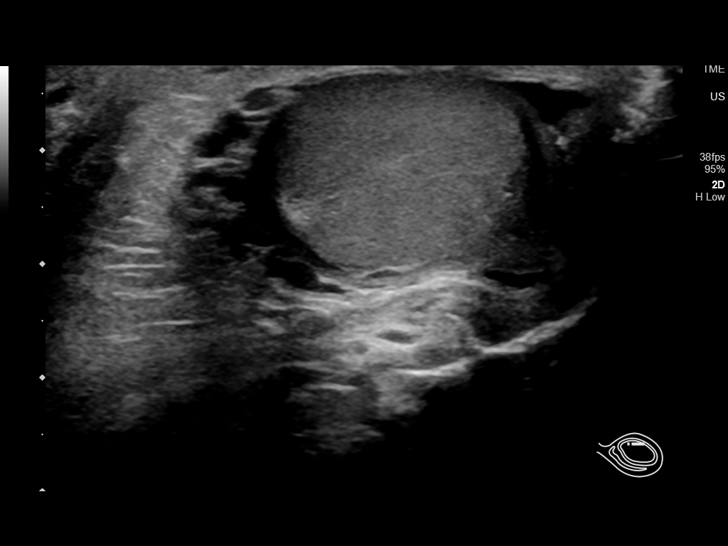
[im 47/76]
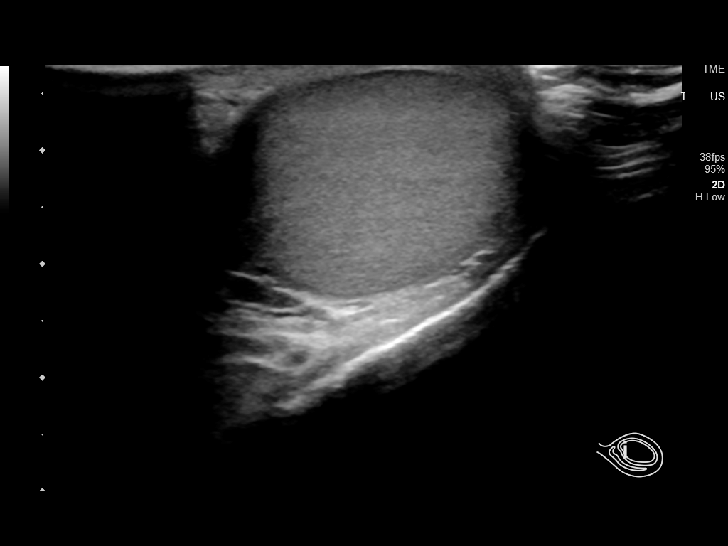
[im 51/76]
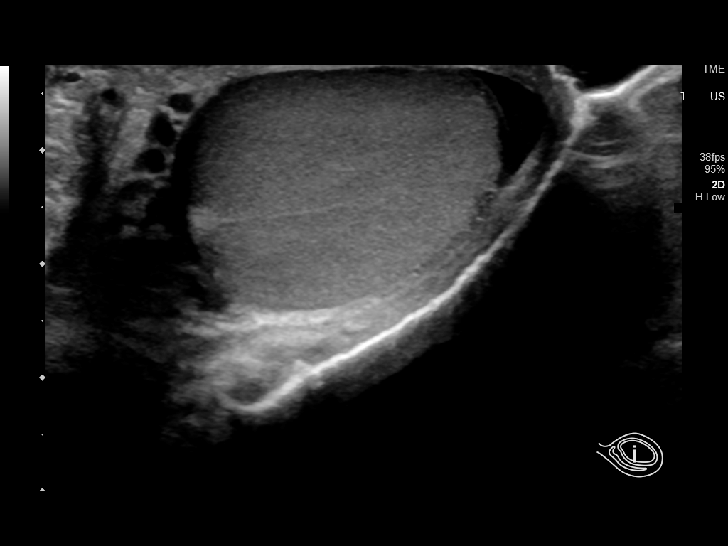
[im 57/76]
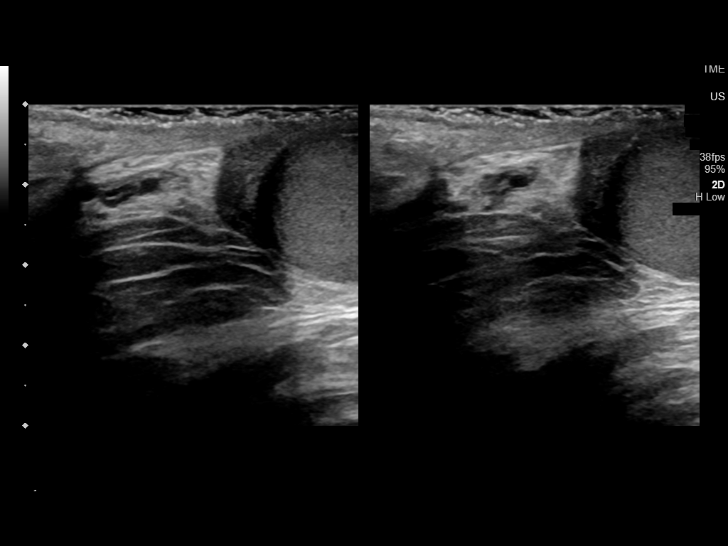
[im 63/76]
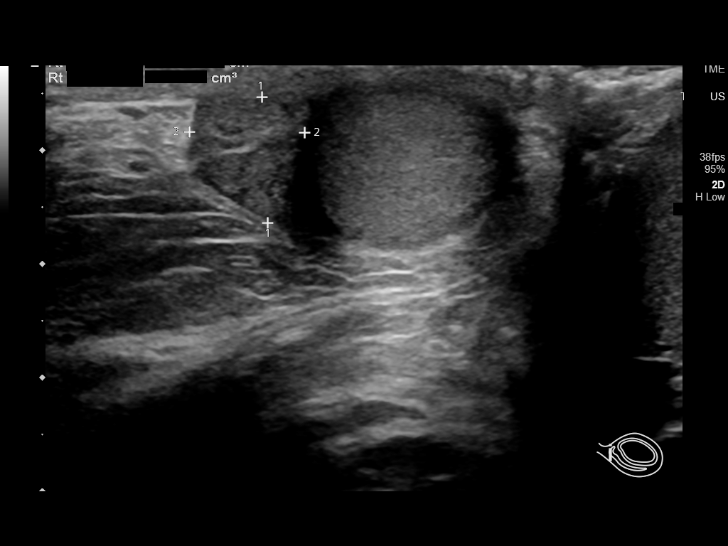
[im 69/76]
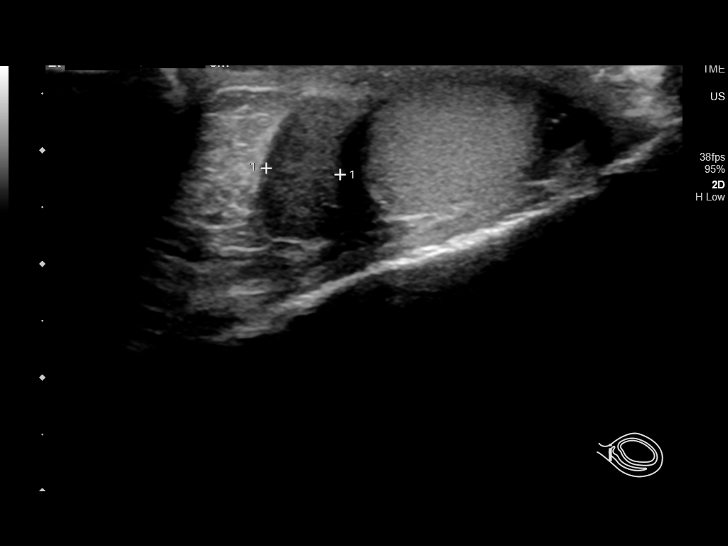
[im 76/76]
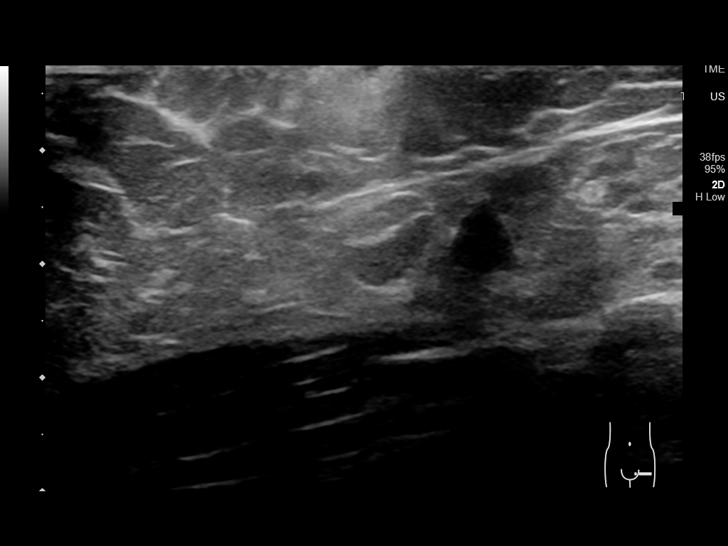

[14 of 25 positions shown; findings below may reference images not displayed]

FINDINGS: Right testicle

Measurements: 3.3 x 2.3 by 2.8 cm. No mass or microlithiasis
visualized.

Left testicle

Measurements: 3.5 x 2.0 by 2.7 cm. No mass or microlithiasis
visualized.

Right epididymis:  Normal in size and appearance.

Left epididymis:  Normal in size and appearance.

Hydrocele:  Trace left hydrocele.

Varicocele:  None visualized.

Pulsed Doppler interrogation of both testes demonstrates normal low
resistance arterial and venous waveforms bilaterally.
IMPRESSION: 1. Trace left hydrocele. Otherwise unremarkable testicular
ultrasound.
